# Patient Record
Sex: Male | Born: 1960 | Marital: Married | Smoking: Never smoker
Health system: Southern US, Community
[De-identification: ages and names within clinical notes are randomized; demographics above are authoritative.]

---

## 2017-11-14 ENCOUNTER — Other Ambulatory Visit: Payer: Self-pay | Admitting: Neurosurgery

## 2017-11-14 DIAGNOSIS — M5126 Other intervertebral disc displacement, lumbar region: Secondary | ICD-10-CM

## 2017-11-26 ENCOUNTER — Ambulatory Visit
Admission: RE | Admit: 2017-11-26 | Discharge: 2017-11-26 | Disposition: A | Discharge status: Home / Self Care | Payer: Managed Care, Other (non HMO) | Source: Ambulatory Visit | Attending: Neurosurgery | Admitting: Neurosurgery

## 2017-11-26 DIAGNOSIS — M5126 Other intervertebral disc displacement, lumbar region: Secondary | ICD-10-CM

## 2017-11-26 MED ORDER — METHYLPREDNISOLONE ACETATE 40 MG/ML INJ SUSP (RADIOLOG
120.0000 mg | Freq: Once | INTRAMUSCULAR | Status: AC
  Administered 2017-11-26: 120 mg via EPIDURAL

## 2017-11-26 MED ORDER — IOPAMIDOL (ISOVUE-M 200) INJECTION 41%
1.0000 mL | Freq: Once | INTRAMUSCULAR | Status: AC
  Administered 2017-11-26: 1 mL via EPIDURAL

## 2017-11-26 NOTE — Discharge Instructions (Signed)

## 2018-06-05 NOTE — Progress Notes (Signed)
 DATE: 06/05/2018  PATIENT: Nathaniel Rollins MRN: [id number] DOB: 30-Jul-1960  AGE: 58 y.o.  DZK:[id number]  PHONE: [phone] (home)  ADDRESS: [address] [place] [address] PCP: Nathaniel Nathaniel Reno, MD Facility:[facility]  Subjective:    HPI: Interim History: [name] comes in today doing reasonably well with his back.  As we recall his shoulder remains problematic.  Our intention was to have him visit with Dr. Case.  He is still awaiting insurance    Past Medical History:  Diagnosis Date  . Hypertension    Past Surgical History:  Procedure Laterality Date  . cancer surgery of throat    . HERNIA REPAIR    . SPINE SURGERY  01/23/2018   left L4-L5 discectomy    Family History  Problem Relation Age of Onset  . Hypertension Mother   . No Known Problems Father    Social History   Socioeconomic History  . Marital status: Married    Spouse name: None  . Number of children: None  . Years of education: None  . Highest education level: None  Occupational History  . None  Social Needs  . Financial resource strain: None  . Food insecurity    Worry: None    Inability: None  . Transportation needs    Medical: None    Non-medical: None  Tobacco Use  . Smoking status: Former Games developer  . Smokeless tobacco: Never Used  Substance and Sexual Activity  . Alcohol use: Not Currently    Frequency: Never  . Drug use: Never  . Sexual activity: None  Lifestyle  . Physical activity    Days per week: None    Minutes per session: None  . Stress: None  Relationships  . Social Musician on phone: None    Gets together: None    Attends religious service: None    Active member of club or organization: None    Attends meetings of clubs or organizations: None    Relationship status: None  Other Topics Concern  . Exercise Yes  . Living Situation Yes  Social History Narrative  . None   Current Outpatient Medications  Medication Sig Dispense  Refill  . amLODIPine (NORVASC) 2.5 MG tablet     . aspirin (ECOTRIN) 81 MG tablet Take 81 mg by mouth daily.    . diclofenac (VOLTAREN) 75 MG EC tablet Take 75 mg by mouth Two (2) times a day.  0  . methocarbamol (ROBAXIN) 500 MG tablet Take 500 mg by mouth Two (2) times a day.  0  . oxyCODONE-acetaminophen (PERCOCET) 5-325 mg per tablet TAKE 1 TABLET BY MOUTH EVERY 4 HOURS AS NEEDED  0   No current facility-administered medications for this visit.     Allergies: Patient has no known allergies.     Objective:    BP 137/76   Pulse 64   Temp 36.9 C (98.5 F)   Ht 170.2 cm (5' 7)   Wt 83.5 kg (184 lb)   SpO2 96%   BMI 28.82 kg/m   Examination: General Exam: He has crepitus of his left shoulder, rotational testing his strength is full in his lower extremities.    Assessment:    Diagnosis ICD-10-CM Associated Orders  1. 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 Other specified joint disorders, left shoulder M25.812   Needs to visit with Dr. Case regarding his left shoulder, he is going to contact  me when he has insurance and I will make that referral.  I want to see him back in about 6 weeks.  Plan:     Preventive:  Counseling: Care goal follow up plan - Above normal BMI follow up - Dietary management education, guidance and counseling.  Screening/Special test: Fall Risk Assessment Screening - no falls in the past year Assessment: performed  Plan of care: documented. Pain Management: Follow up plan documented - Yes   MARK LELON CARWIN, MD  [facility] [address] [place] [address]

## 2018-11-24 IMAGING — XA Imaging study
1 series · 1 of 1 positions shown · non-contrast
Comparison: none

CLINICAL DATA: Lumbosacral spondylosis without myelopathy. Disc
extrusion at L4-5, greater on the LEFT. LEFT leg radicular symptoms
predominate.

[Series 2: ortho standard · 1 of 1 slices shown]
[im 1/1]
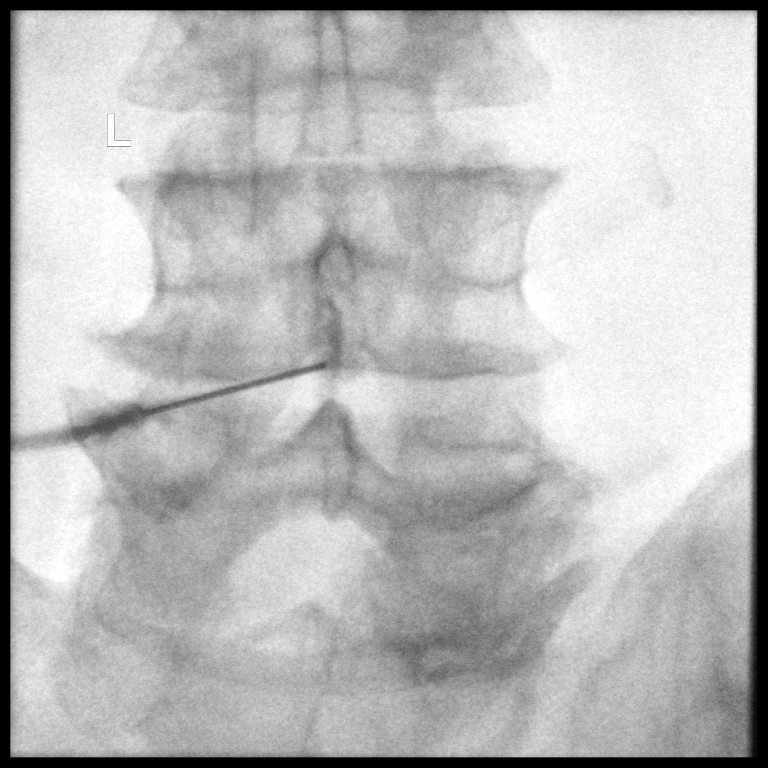

[1 of 1 positions shown; findings below may reference images not displayed]

FLUOROSCOPY TIME:  9 seconds corresponding to a Dose Area Product of
18.44 Gy*m2

PROCEDURE:
The procedure, risks, benefits, and alternatives were explained to
the patient. Questions regarding the procedure were encouraged and
answered. The patient understands and consents to the procedure.

LUMBAR EPIDURAL INJECTION:

An interlaminar approach was performed on LEFT at L4-5. The
overlying skin was cleansed and anesthetized. A 20 gauge epidural
needle was advanced using loss-of-resistance technique.

DIAGNOSTIC EPIDURAL INJECTION:

Injection of Isovue-M 200 shows a good epidural pattern with spread
above and below the level of needle placement, primarily on the
LEFT; no vascular opacification is seen.

THERAPEUTIC EPIDURAL INJECTION:

120.0 mg of Depo-Medrol mixed with 2 mL 1% lidocaine were instilled.
The procedure was well-tolerated, and the patient was discharged
thirty minutes following the injection in good condition.

COMPLICATIONS:
None.
IMPRESSION: Technically successful epidural injection on the LEFT L4-5 # 1.

## 2021-11-21 ENCOUNTER — Other Ambulatory Visit: Payer: Self-pay | Admitting: Neurology

## 2021-11-21 ENCOUNTER — Other Ambulatory Visit (HOSPITAL_COMMUNITY): Payer: Self-pay | Admitting: Neurology

## 2021-11-21 DIAGNOSIS — R413 Other amnesia: Secondary | ICD-10-CM

## 2021-12-05 ENCOUNTER — Ambulatory Visit (HOSPITAL_COMMUNITY)
Admission: RE | Admit: 2021-12-05 | Discharge: 2021-12-05 | Disposition: A | Discharge status: Home / Self Care | Payer: Commercial Managed Care - PPO | Source: Ambulatory Visit | Attending: Neurology | Admitting: Neurology

## 2021-12-05 DIAGNOSIS — R413 Other amnesia: Secondary | ICD-10-CM | POA: Insufficient documentation

## 2021-12-05 MED ORDER — GADOBUTROL 1 MMOL/ML IV SOLN
7.0000 mL | Freq: Once | INTRAVENOUS | Status: AC | PRN
  Administered 2021-12-05: 7 mL via INTRAVENOUS

## 2024-02-12 ENCOUNTER — Encounter: Payer: Self-pay | Admitting: Diagnostic Neuroimaging

## 2024-02-12 ENCOUNTER — Ambulatory Visit (INDEPENDENT_AMBULATORY_CARE_PROVIDER_SITE_OTHER): Admitting: Diagnostic Neuroimaging

## 2024-02-12 VITALS — BP 112/68 | HR 78 | Ht 66.0 in | Wt 169.0 lb

## 2024-02-12 DIAGNOSIS — R413 Other amnesia: Secondary | ICD-10-CM

## 2024-02-12 DIAGNOSIS — F482 Pseudobulbar affect: Secondary | ICD-10-CM | POA: Diagnosis not present

## 2024-02-12 DIAGNOSIS — R531 Weakness: Secondary | ICD-10-CM

## 2024-02-12 DIAGNOSIS — G252 Other specified forms of tremor: Secondary | ICD-10-CM | POA: Diagnosis not present

## 2024-02-12 DIAGNOSIS — Z114 Encounter for screening for human immunodeficiency virus [HIV]: Secondary | ICD-10-CM

## 2024-02-12 MED ORDER — SERTRALINE HCL 25 MG PO TABS: 25.0000 mg | ORAL_TABLET | Freq: Every day | ORAL | 12 refills | Status: AC

## 2024-02-12 NOTE — Patient Instructions (Signed)
  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) - check labs, MRI brain, EEG - check DATscan (eval for parkinsonian disorders such as dementia with lewy bodies) - continue memantine, donepezil - start sertraline 25 mg daily (mood stabilizer) - try to stay active physically and get some exercise (at least 15-30 minutes per day) - eat a nutritious diet with lean protein, plants / vegetables, whole grains; avoid ultra-processed foods - increase social activities, brain stimulation, games, puzzles, hobbies, crafts, arts, music; try new activities - aim for at least 7-8 hours sleep per night (or more) - avoid smoking and alcohol - needs supervision of medications, finances; no driving - safety / supervision issues reviewed - caregiver resources provided (including [email or web address])

## 2024-02-12 NOTE — Progress Notes (Signed)
 [facility]  PATIENT: Nathaniel Rollins DOB: [birth year]  REFERRING CLINICIAN: Orpha Yancey LABOR, MD HISTORY FROM: patient and wife REASON FOR VISIT: new consult   HISTORICAL  CHIEF COMPLAINT:  Chief Complaint  Patient presents with   Memory Loss    Rm 7 with spouse Pt is well, spouse reports he has been having short term memory concerns post covid 2022. He has been forgetting normal activities and frequent locations getting lost.     HISTORY OF PRESENT ILLNESS:   63 year old male here for evaluation of memory cognitive and physical decline.  Patient had onset of symptoms around 2022 starting after a COVID infection.  Symptoms progressively worsened over time.  He saw a neurologist Dr. Milton who started patient on donepezil and memantine.  Patient followed up with his PCP.  Patient's wife is not sure of any official diagnosis.  Symptoms have progressive worsened over time.  Now he is quite severely affected.  He needs almost total care for his ADLs.  He has significantly lost ability to communicate and speak or follow instructions.  He also has intermittent crying spells.   REVIEW OF SYSTEMS: Full 14 system review of systems performed and negative with exception of: as per HPI.  ALLERGIES: Not on File  HOME MEDICATIONS: Outpatient Medications Prior to Visit  Medication Sig Dispense Refill   amLODipine (NORVASC) 10 MG tablet Take 10 mg by mouth daily.     donepezil (ARICEPT) 10 MG tablet Take 10 mg by mouth at bedtime.     losartan (COZAAR) 100 MG tablet Take 100 mg by mouth daily.     memantine (NAMENDA) 5 MG tablet Take 5 mg by mouth 2 (two) times daily.     No facility-administered medications prior to visit.    PAST MEDICAL HISTORY: History reviewed. No pertinent past medical history.  PAST SURGICAL HISTORY: History reviewed. No pertinent surgical history.  FAMILY HISTORY: History reviewed. No pertinent family history.  SOCIAL  HISTORY: Social History   Socioeconomic History   Marital status: Married    Spouse name: Not on file   Number of children: Not on file   Years of education: Not on file   Highest education level: Not on file  Occupational History   Occupation: unemployeed  Tobacco Use   Smoking status: Never   Smokeless tobacco: Never  Substance and Sexual Activity   Alcohol use: Not Currently   Drug use: Not Currently   Sexual activity: Not on file  Other Topics Concern   Not on file  Social History Narrative   Not on file   Social Drivers of Health   Financial Resource Strain: Not on file  Food Insecurity: Not on file  Transportation Needs: Not on file  Physical Activity: Not on file  Stress: Not on file  Social Connections: Not on file  Intimate Partner Violence: Not on file     PHYSICAL EXAM  GENERAL EXAM/CONSTITUTIONAL: Vitals:  Vitals:   02/12/24 0842  BP: 112/68  Pulse: 78  Weight: 169 lb (76.7 kg)  Height: 5' 6 (1.676 m)   Body mass index is 27.28 kg/m. Wt Readings from Last 3 Encounters:  02/12/24 169 lb (76.7 kg)   Patient is in no distress; well developed, nourished and groomed; neck is supple  CARDIOVASCULAR: Examination of carotid arteries is normal; no carotid bruits Regular rate and rhythm, no murmurs Examination of peripheral vascular system by observation and palpation is normal  EYES: Ophthalmoscopic exam of optic discs and posterior  segments is normal; no papilledema or hemorrhages No results found.  MUSCULOSKELETAL: Gait, strength, tone, movements noted in Neurologic exam below  NEUROLOGIC: MENTAL STATUS:     02/12/2024    8:47 AM  MMSE - Mini Mental State Exam  Not completed: Refused;Unable to complete   awake, alert, oriented to person SEVERE EXPRESSIVE AND RECEPTIVE DEFICITS NOT ABLE TO HOLD CONVERSATION ABLE TO SAY HIS NAME, YES AND NO SOME QUESTIONS; COUNTS FINGERS ON MY HAND, BUT NOT RELIABLY EYE OPENING APRAXIA INTERMITTENTLY  TEARFUL  CRANIAL NERVE:  2nd, 3rd, 4th, 6th - pupils equal and reactive to light, visual fields full to confrontation, extraocular muscles intact, no nystagmus 5th - facial sensation symmetric 7th - facial strength symmetric 8th - hearing intact 9th - NOT FOLLOWING 11th - shoulder shrug --> LIMITED  12th - tongue protrusion --> LIMITED  MOTOR:  PARATONIA IN BUE AND BLE INTERMITTENT RESTING TREMOR IN LUE SLOW MOVEMENTS THROUGHOUT BUE 2-3 BLE 1-2 MOTOR IMPERSISTENCE / LOW EFFORT ON EXAM  SENSORY:  normal and symmetric to light touch  COORDINATION:  finger-nose-finger, fine finger movements --> NOT FOLLOWING COMMANDS  REFLEXES:  deep tendon reflexes 1+ and symmetric  GAIT/STATION:  IN WHEELCHAIR     DIAGNOSTIC DATA (LABS, IMAGING, TESTING) - I reviewed patient records, labs, notes, testing and imaging myself where available.  No results found for: WBC, HGB, HCT, MCV, PLT No results found for: NA, K, CL, CO2, GLUCOSE, BUN, CREATININE, CALCIUM, PROT, ALBUMIN, AST, ALT, ALKPHOS, BILITOT, GFRNONAA, GFRAA No results found for: CHOL, HDL, LDLCALC, LDLDIRECT, TRIG, CHOLHDL No results found for: YHAJ8R No results found for: VITAMINB12 No results found for: TSH  12/05/21 MRI BRAIN [I reviewed images myself and agree with interpretation. -VRP]  - Cerebral volume normal for age.  No acute abnormality - Chronic ischemic changes as above.   ASSESSMENT AND PLAN  63 y.o. year old male here with:   Dx:  1. Memory loss   2. Resting tremor   3. Pseudobulbar affect   4. Weakness   5. 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; other considerations include autoimmune encephalitis, prion disease, infectious / metabolic disorders) -  check labs, MRI brain, EEG - check DATscan (eval for parkinsonian disorders such as dementia with lewy bodies) - continue memantine, donepezil - start sertraline 25 mg daily (mood stabilizer) - try to stay active physically and get some exercise (at least 15-30 minutes per day) - eat a nutritious diet with lean protein, plants / vegetables, whole grains; avoid ultra-processed foods - increase social activities, brain stimulation, games, puzzles, hobbies, crafts, arts, music; try new activities - aim for at least 7-8 hours sleep per night (or more) - avoid smoking and alcohol - needs supervision of medications, finances; no driving - safety / supervision issues reviewed - caregiver resources provided (including [email or web address])  Orders Placed This Encounter  Procedures   MR BRAIN W WO CONTRAST   NM BRAIN DATSCAN TUMOR LOC INFLAM SPECT 1 DAY   ATN PROFILE   Vitamin B12   Autoimmune Neurology Ab   TSH Rfx on Abnormal to Free T4   CBC with Differential/Platelet   Comprehensive metabolic panel with GFR   HIV Antibody (routine testing w rflx)   RPR w/reflex to TrepSure   Vitamin B1   EEG adult   Meds ordered this encounter  Medications   sertraline (ZOLOFT) 25 MG tablet    Sig: Take 1 tablet (25 mg total) by mouth daily.    Dispense:  30 tablet    Refill:  12   Return for pending if symptoms worsen or fail to improve, pending test results.  I reviewed images, labs, notes, records myself. I summarized findings and reviewed with patient, for this high risk condition (rapid progressive dementia, myoclonus, tremor) requiring high complexity decision making.    EDUARD FABIENE HANLON, MD 02/12/2024, 10:03 AM Certified in Neurology, Neurophysiology and Neuroimaging  [facility] [address] [place], [place] [address] [phone]

## 2024-02-15 LAB — AUTOIMMUNE NEUROLOGY AB
AGNA-1: NEGATIVE
AMPA-R1 Antibody: NEGATIVE
AMPA-R2 Antibody: NEGATIVE
Amphiphysin Antibody: NEGATIVE
Anti-Hu Ab: NEGATIVE
Anti-Ri Ab: NEGATIVE
Anti-Yo Ab: NEGATIVE
Antineruonal nuclear Ab Type 3: NEGATIVE
Aquaporin 4 Antibody: NEGATIVE
CASPR2 Antibody,Cell-based IFA: NEGATIVE
CRMP-5 IgG: NEGATIVE
DNER Antibody: NEGATIVE
DPPX Antibody: NEGATIVE
GABA-B-R Antibody: NEGATIVE
GAD65 Antibody: NEGATIVE
ITPR1 Antibody: NEGATIVE
IgLON5 Antibody: NEGATIVE
Interpretation: NEGATIVE
LGI1 Antibody, Cell-based IFA: NEGATIVE
Ma2/Ta Antibody: NEGATIVE
NMDA-R Antibody: NEGATIVE
Purkinje Cell Cyto Ab Type 2: NEGATIVE
Purkinje Cell Cyto Ab Type Tr: NEGATIVE
VGCC Antibody: 9.3 pmol/L (ref 0.0–30.0)
Zic4 Antibody: NEGATIVE
mGluR1 Antibody: NEGATIVE

## 2024-02-15 LAB — COMPREHENSIVE METABOLIC PANEL WITH GFR
ALT: 11 IU/L (ref 0–44)
AST: 23 IU/L (ref 0–40)
Albumin: 3.8 g/dL — ABNORMAL LOW (ref 3.9–4.9)
Alkaline Phosphatase: 91 IU/L (ref 47–123)
BUN/Creatinine Ratio: 16 (ref 10–24)
BUN: 19 mg/dL (ref 8–27)
Bilirubin Total: 0.9 mg/dL (ref 0.0–1.2)
CO2: 22 mmol/L (ref 20–29)
Calcium: 9.2 mg/dL (ref 8.6–10.2)
Chloride: 110 mmol/L — ABNORMAL HIGH (ref 96–106)
Creatinine, Ser: 1.18 mg/dL (ref 0.76–1.27)
Globulin, Total: 2.8 g/dL (ref 1.5–4.5)
Glucose: 102 mg/dL — ABNORMAL HIGH (ref 70–99)
Potassium: 4.3 mmol/L (ref 3.5–5.2)
Sodium: 146 mmol/L — ABNORMAL HIGH (ref 134–144)
Total Protein: 6.6 g/dL (ref 6.0–8.5)
eGFR: 68 mL/min/1.73 (ref >59)

## 2024-02-15 LAB — CBC WITH DIFFERENTIAL/PLATELET
Basophils Absolute: 0.1 x10E3/uL (ref 0.0–0.2)
Basos: 1 %
EOS (ABSOLUTE): 0.2 x10E3/uL (ref 0.0–0.4)
Eos: 3 %
Hematocrit: 44.1 % (ref 37.5–51.0)
Hemoglobin: 14.2 g/dL (ref 13.0–17.7)
Immature Grans (Abs): 0 x10E3/uL (ref 0.0–0.1)
Immature Granulocytes: 0 %
Lymphocytes Absolute: 1.2 x10E3/uL (ref 0.7–3.1)
Lymphs: 16 %
MCH: 32.8 pg (ref 26.6–33.0)
MCHC: 32.2 g/dL (ref 31.5–35.7)
MCV: 102 fL — ABNORMAL HIGH (ref 79–97)
Monocytes Absolute: 0.7 x10E3/uL (ref 0.1–0.9)
Monocytes: 10 %
Neutrophils Absolute: 5.5 x10E3/uL (ref 1.4–7.0)
Neutrophils: 70 %
Platelets: 176 x10E3/uL (ref 150–450)
RBC: 4.33 x10E6/uL (ref 4.14–5.80)
RDW: 12.1 % (ref 11.6–15.4)
WBC: 7.7 x10E3/uL (ref 3.4–10.8)

## 2024-02-15 LAB — TSH RFX ON ABNORMAL TO FREE T4: TSH: 0.95 u[IU]/mL (ref 0.450–4.500)

## 2024-02-15 LAB — ATN PROFILE
A -- Beta-amyloid 42/40 Ratio: 0.108 (ref >0.102)
Beta-amyloid 40: 165.86 pg/mL
Beta-amyloid 42: 17.85 pg/mL
N -- NfL, Plasma: 25.3 pg/mL — ABNORMAL HIGH (ref 0.00–3.65)
T -- p-tau181: 1.36 pg/mL — ABNORMAL HIGH (ref 0.00–0.97)

## 2024-02-15 LAB — TREPONEMAL ANTIBODIES, TPPA: Treponemal Antibodies, TPPA: NONREACTIVE

## 2024-02-15 LAB — HIV ANTIBODY (ROUTINE TESTING W REFLEX): HIV Screen 4th Generation wRfx: NONREACTIVE

## 2024-02-15 LAB — VITAMIN B12: Vitamin B-12: 881 pg/mL (ref 232–1245)

## 2024-02-15 LAB — RPR W/REFLEX TO TREPSURE: RPR: NONREACTIVE

## 2024-02-17 ENCOUNTER — Ambulatory Visit: Payer: Self-pay | Admitting: Diagnostic Neuroimaging

## 2024-02-20 ENCOUNTER — Encounter: Payer: Self-pay | Admitting: *Deleted

## 2024-02-20 ENCOUNTER — Encounter: Payer: Self-pay | Admitting: Diagnostic Neuroimaging

## 2024-02-20 ENCOUNTER — Ambulatory Visit (INDEPENDENT_AMBULATORY_CARE_PROVIDER_SITE_OTHER): Admitting: Diagnostic Neuroimaging

## 2024-02-20 DIAGNOSIS — R531 Weakness: Secondary | ICD-10-CM

## 2024-02-20 DIAGNOSIS — F482 Pseudobulbar affect: Secondary | ICD-10-CM

## 2024-02-20 DIAGNOSIS — R4182 Altered mental status, unspecified: Secondary | ICD-10-CM

## 2024-02-20 DIAGNOSIS — G252 Other specified forms of tremor: Secondary | ICD-10-CM

## 2024-02-20 DIAGNOSIS — R413 Other amnesia: Secondary | ICD-10-CM

## 2024-02-27 ENCOUNTER — Encounter (HOSPITAL_COMMUNITY): Payer: Self-pay

## 2024-02-27 ENCOUNTER — Ambulatory Visit (HOSPITAL_COMMUNITY)
Admission: RE | Admit: 2024-02-27 | Discharge: 2024-02-27 | Disposition: A | Discharge status: Home / Self Care | Payer: Self-pay | Source: Ambulatory Visit | Attending: Diagnostic Neuroimaging | Admitting: Diagnostic Neuroimaging

## 2024-02-27 DIAGNOSIS — R531 Weakness: Secondary | ICD-10-CM | POA: Diagnosis present

## 2024-02-27 DIAGNOSIS — G252 Other specified forms of tremor: Secondary | ICD-10-CM | POA: Insufficient documentation

## 2024-02-27 DIAGNOSIS — F482 Pseudobulbar affect: Secondary | ICD-10-CM | POA: Diagnosis present

## 2024-02-27 DIAGNOSIS — R413 Other amnesia: Secondary | ICD-10-CM | POA: Diagnosis present

## 2024-02-27 MED ORDER — POTASSIUM IODIDE (ANTIDOTE) 130 MG PO TABS
ORAL_TABLET | ORAL | Status: AC
  Filled 2024-02-27: qty 1

## 2024-02-27 MED ORDER — IOFLUPANE I 123 185 MBQ/2.5ML IV SOLN
5.0000 | Freq: Once | INTRAVENOUS | Status: DC
  Filled 2024-02-27: qty 5

## 2024-02-27 NOTE — Progress Notes (Incomplete)
 Conducting a DaT study on said patient. During 1st part of study, patient was very compliant with procedure. Although patient in pretty much non-verbal

## 2024-03-02 NOTE — Procedures (Signed)
   [facility]  EEG (ELECTROENCEPHALOGRAM) REPORT   STUDY DATE: 02/20/2024 PATIENT NAME: Nathaniel Rollins DOB: 13-Jul-1960 MRN: [id number]  ORDERING CLINICIAN: Eduard Hanlon, MD   TECHNOLOGIST: MARLA Plummer TECHNIQUE: Electroencephalogram was recorded utilizing standard 10-20 system of lead placement and reformatted into average and bipolar montages.  RECORDING TIME: 25 minutes ACTIVATION: photic stimulation  CLINICAL INFORMATION: 63 year old male with dementia / memory loss.  FINDINGS: Posterior dominant background rhythms, which attenuate with eye opening, ranging 10-12 hertz and 10-15 microvolts.  Background motor artifact noted.  No focal, lateralizing, epileptiform activity or seizures are seen. Patient recorded in the awake and drowsy state. EKG channel shows regular rhythm of 60-65 beats per minute.   IMPRESSION:   Normal EEG in awake and drowsy days.    INTERPRETING PHYSICIAN:  EDUARD FABIENE HANLON, MD Certified in Neurology, Neurophysiology and Neuroimaging  [facility] [address], [address] [place], [place] [address] [phone]

## 2024-03-10 ENCOUNTER — Telehealth: Payer: Self-pay | Admitting: Diagnostic Neuroimaging

## 2024-03-10 NOTE — Telephone Encounter (Signed)
 Nathaniel Rollins from [facility] called to speak to MD or nurse about Pt . Pt was found unresponsive on bence today  and was  labeled as a  Nathaniel Rollins  Ambulance rushed to ER at [facility] . Where he is in the care of  Dr. Karlene  . Nathaniel Rollins stated  they needed to speak to Nurse Or MD about Pt Condition and treatment   Callback number is  [phone]

## 2024-03-10 NOTE — Telephone Encounter (Signed)
 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   Nathaniel FABIENE HANLON, MD 03/10/2024, 2:32 PM Certified in Neurology, Neurophysiology and Neuroimaging  [facility] [address], [address] [place], [place] [address] [phone]

## 2024-03-10 NOTE — Telephone Encounter (Signed)
 Spoke with Dr. Karlene ar [phone] Patient had walked away from home andPatient was found in a ditch, no trauma, BP 90/52, pulse 79,   Lactic acid 6.4, then 2.5.   7mm mass found on CT taken today , anterior aspect of the third ventricle.  I reviewed recent and past  imaging and ATN profile with Dr. Karlene and discussed with Dr. Donita. He advised Dr.  Karlene to have neurology or neurosurgery consult for new mass.  Dr. Karlene appreciative of call. She also stated that wife said he was back at his baseline.

## 2024-03-13 ENCOUNTER — Other Ambulatory Visit: Payer: Self-pay

## 2024-05-24 DEATH — deceased
